# Patient Record
Sex: Male | Born: 2005 | Hispanic: No | Marital: Single | Smoking: Never smoker
Health system: Southern US, Community
[De-identification: ages and names within clinical notes are randomized; demographics above are authoritative.]

## PROBLEM LIST (undated history)

## (undated) DIAGNOSIS — M549 Dorsalgia, unspecified: Secondary | ICD-10-CM

## (undated) DIAGNOSIS — E785 Hyperlipidemia, unspecified: Secondary | ICD-10-CM

## (undated) DIAGNOSIS — H6692 Otitis media, unspecified, left ear: Secondary | ICD-10-CM

## (undated) DIAGNOSIS — E119 Type 2 diabetes mellitus without complications: Secondary | ICD-10-CM

## (undated) HISTORY — DX: Type 2 diabetes mellitus without complications: E11.9

## (undated) HISTORY — DX: Hyperlipidemia, unspecified: E78.5

## (undated) HISTORY — DX: Dorsalgia, unspecified: M54.9

---
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
## 2023-04-29 ENCOUNTER — Ambulatory Visit (INDEPENDENT_AMBULATORY_CARE_PROVIDER_SITE_OTHER): Payer: Medicaid Other | Admitting: Pediatrics

## 2023-04-29 ENCOUNTER — Encounter (INDEPENDENT_AMBULATORY_CARE_PROVIDER_SITE_OTHER): Payer: Self-pay

## 2023-04-29 ENCOUNTER — Encounter (INDEPENDENT_AMBULATORY_CARE_PROVIDER_SITE_OTHER): Payer: Self-pay | Admitting: Pediatrics

## 2023-04-29 VITALS — BP 120/82 | HR 72 | Ht 71.77 in | Wt 273.0 lb

## 2023-04-29 DIAGNOSIS — Z6837 Body mass index (BMI) 37.0-37.9, adult: Secondary | ICD-10-CM | POA: Diagnosis not present

## 2023-04-29 DIAGNOSIS — E559 Vitamin D deficiency, unspecified: Secondary | ICD-10-CM

## 2023-04-29 DIAGNOSIS — Z713 Dietary counseling and surveillance: Secondary | ICD-10-CM | POA: Diagnosis not present

## 2023-04-29 DIAGNOSIS — E66812 Obesity, class 2: Secondary | ICD-10-CM

## 2023-04-29 DIAGNOSIS — E8881 Metabolic syndrome: Secondary | ICD-10-CM | POA: Diagnosis not present

## 2023-04-29 DIAGNOSIS — E119 Type 2 diabetes mellitus without complications: Secondary | ICD-10-CM

## 2023-04-29 DIAGNOSIS — E781 Pure hyperglyceridemia: Secondary | ICD-10-CM | POA: Diagnosis not present

## 2023-04-29 DIAGNOSIS — Z7985 Long-term (current) use of injectable non-insulin antidiabetic drugs: Secondary | ICD-10-CM

## 2023-04-29 LAB — POCT GLUCOSE (DEVICE FOR HOME USE): POC Glucose: 110 mg/dL — AB (ref 70–99)

## 2023-04-29 LAB — POCT GLYCOSYLATED HEMOGLOBIN (HGB A1C): Hemoglobin A1C: 5.4 % (ref 4.0–5.6)

## 2023-04-29 MED ORDER — BD PEN NEEDLE NANO 2ND GEN 32G X 4 MM MISC
0 refills | Status: AC
Start: 2023-04-29 — End: ?

## 2023-04-29 MED ORDER — OZEMPIC (1 MG/DOSE) 4 MG/3ML ~~LOC~~ SOPN
PEN_INJECTOR | SUBCUTANEOUS | 5 refills | Status: DC
Start: 2023-04-29 — End: 2023-08-03

## 2023-04-29 NOTE — Assessment & Plan Note (Signed)
Continue working on dietary changes.

## 2023-04-29 NOTE — Assessment & Plan Note (Signed)
-  Hba1c stable and will continue Ozempic -continue working on lifestyle changes -fasting labs before next visit

## 2023-04-29 NOTE — Patient Instructions (Addendum)
HbA1c Goals: Our ultimate goal is to achieve the lowest possible HbA1c while avoiding recurrent severe hypoglycemia.  However, all HbA1c goals must be individualized per the American Diabetes Association Clinical Standards. My Hemoglobin A1c History:  Lab Results  Component Value Date   HGBA1C 5.4 04/29/2023   HGBA1C 5.4 12/25/2022   HGBA1C 5.6 09/19/2022   HGBA1C 6.5 (A) 06/26/2022   HGBA1C 6.4 (H) 03/25/2022   HGBA1C 5.7 (H) 11/26/2021   HGBA1C 5.6 05/28/2021   HGBA1C 5.8 (A) 02/25/2021   HGBA1C 6.2 (H) 08/16/2020   My goal HbA1c is: < 7 %  This is equivalent to an average blood glucose of:  HbA1c % = Average BG  5  97 (78-120)__ 6  126 (100-152)  7  154 (123-185) 8  183 (147-217)  9  212 (170-249)  10  240 (193-282)  11  269 (217-314)  12  298 (240-347)  13  330    Time in Range (TIR) Goals: Target Range over 70% of the time and Very Low less than 4% of the time.  Labs: Please obtain fasting (no eating, but can drink water) labs 1-2 weeks before the next visit.  Labs have been ordered to: [facility] is in our office Monday, Tuesday, Wednesday and Friday from 8AM-4PM, closed for lunch around 12pm-1pm. On Thursday, you can go to the third floor, Pediatric Neurology office at [address] [place], [place] [address]. You do not need an appointment, as they see patients in the order they arrive.  Let the front staff know that you are here for labs, and they will help you get to the [facility] lab. You can also go to any [facility] lab in your area as the request was sent electronically.   Insulin:  Type 2 diabetes managed with Ozempic and no regular testing of glucoses. Medications:  Please allow 3 days for prescription refill requests! After hours are for emergencies only.  Check Blood Glucose:  Before breakfast, before lunch, before dinner, at bedtime, and for symptoms of high or low blood glucose as a minimum.  Check BG 2 hours after meals if adjusting doses.   Check more frequently on days  with more activity than normal.   Check in the middle of the night when evening insulin doses are changed, on days with extra activity in the evening, and if you suspect overnight low glucoses are occurring.   Send a MyChart message as needed for patterns of high or low glucose levels, or multiple low glucoses. As a general rule, ALWAYS call us to review your child's blood glucoses IF: Your child has a seizure You have to use glucagon/Baqsimi/Gvoke or glucose gel to bring up the blood sugar  IF you notice a pattern of high blood sugars  If in a week, your child has: 1 blood glucose that is 40 or less  2 blood glucoses that are 50 or less at the same time of day 3 blood glucoses that are 60 or less at the same time of day  Phone: [phone] Ketones: Check urine or blood ketones, and if blood glucose is greater than 300 mg/dL (injections) or 629 mg/dL (pump), when ill, or if having symptoms of ketones.  Call if Urine Ketones are moderate or large Call if Blood Ketones are moderate (1-1.5) or large (more than1.5) Exercise Plan:  Any activity that makes you sweat most days for 60 minutes.  Safety Wear Medical Alert at [facility] [place] Citizens requesting the Yellow Dot Packages should  contact Airline pilot at the [facility] by calling [phone] or e-mail [email or web address] [email or web address]. TEEN REMINDERS:  Check blood glucose before driving If sexually active, use reliable birth control including condoms.  Alcohol in moderation only - check glucoses more frequently, & have a snack with no carb coverage. Glucose gel/cake icing for low glucose. Check glucoses in the middle of the night. Education:Please refer to your diabetes education book. A copy can be found here: [email or web address] Other: Schedule an eye exam yearly and a dental exam.  Recommend dental cleaning every 6 months. Get a flu vaccine  yearly, and Covid-19 vaccine yearly unless contraindicated. Rotate injections sites and avoid any hard lumps (lipohypertrophy)

## 2023-04-29 NOTE — Assessment & Plan Note (Signed)
-  fasting lipid panel before next visit

## 2023-04-29 NOTE — Progress Notes (Signed)
Pediatric Endocrinology Diabetes Consultation Follow-up Visit Eric Decker 2005/06/20 [id number] Jonetta Osgood, MD  HPI: Eric Decker  is a 17 y.o. male presenting for follow-up of Type 2 Diabetes. Interpreter present throughout the visit: No.  Since last visit on 12/25/2022, he has been well.  There have been no ER visits or hospitalizations. He has been doing well in school. Fall semester completed.  Other diabetes medication(s): Yes Ozempic 1mg , no missed doses and taking vitamin D gummies Hypoglycemia: can feel most low blood sugars.  No glucagon needed recently.  Med-alert ID: is not currently wearing. Injection/Pump sites: trunk Health maintenance:  Diabetes Health Maintenance Due  Topic Date Due   OPHTHALMOLOGY EXAM  Never done   HEMOGLOBIN A1C  10/28/2023   FOOT EXAM  12/25/2023    ROS: Greater than 10 systems reviewed with pertinent positives listed in HPI, otherwise neg. The following portions of the patient's history were reviewed and updated as appropriate:  Past Medical History:  has a past medical history of Acute mycotic otitis externa (03/17/2019), Acute otitis media of left ear in pediatric patient (02/08/2018), Back pain (11/18/2012), Diabetes mellitus without complication (HCC) (06/26/2022), Hyperlipidemia, Mixed hyperlipidemia (11/22/2020), Nonalcoholic fatty liver disease (40/98/1191), and Otitis externa (02/08/2018).  Medications:  Outpatient Encounter Medications as of 04/29/2023  Medication Sig   [DISCONTINUED] OZEMPIC, 1 MG/DOSE, 4 MG/3ML SOPN INJECT 1 MG INTO THE SKIN ONCE A WEEK FOR 4 DOSES   baclofen (LIORESAL) 10 MG tablet Take 1 tablet (10 mg total) by mouth 3 (three) times daily as needed for muscle spasms. (Patient not taking: Reported on 12/25/2022)   Blood Pressure Monitoring (BLOOD PRESSURE CUFF) MISC 1 application. by Does not apply route daily. (Patient not taking: Reported on 09/24/2022)   ibuprofen (ADVIL) 800 MG tablet Take by mouth. (Patient not  taking: Reported on 09/09/2022)   Insulin Pen Needle (BD PEN NEEDLE NANO 2ND GEN) 32G X 4 MM MISC Use as directed weekly with Ozempic   ketoconazole (NIZORAL) 2 % cream Apply 1 application. topically daily. (Patient not taking: Reported on 06/26/2022)   naproxen (NAPROSYN) 250 MG tablet TAKE 1 TABLET(250 MG) BY MOUTH TWICE DAILY WITH A MEAL (Patient not taking: Reported on 06/26/2022)   Semaglutide, 1 MG/DOSE, (OZEMPIC, 1 MG/DOSE,) 4 MG/3ML SOPN INJECT 1 MG INTO THE SKIN ONCE A WEEK FOR 4 DOSES   [DISCONTINUED] Insulin Pen Needle (BD PEN NEEDLE NANO 2ND GEN) 32G X 4 MM MISC Use as directed weekly with Ozempic (Patient not taking: Reported on 12/25/2022)   No facility-administered encounter medications on file as of 04/29/2023.   Allergies: No Known Allergies Surgical History:  History reviewed. No pertinent surgical history. Family History: family history is not on file.  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 Blood pressure %iles are not available for patients who are 18 years or older. 98 %ile (Z= 2.12) based on CDC (Boys, 2-20 Years) BMI-for-age  based on BMI available on 04/29/2023.   Ht Readings from Last 3 Encounters:  04/29/23 5' 11.77" (1.823 m) (78%, Z= 0.77)*  12/25/22 5' 11.65" (1.82 m) (77%, Z= 0.74)*  08/04/22 6' (1.829 m) (81%, Z= 0.88)*   * Growth percentiles are based on CDC (Boys, 2-20 Years) data.   Wt Readings from Last 3 Encounters:  04/29/23 273 lb (123.8 kg) (>99%, Z= 2.73)*  12/25/22 265 lb (120.2 kg) (>99%,  Z= 2.63)*  10/06/22 275 lb 9.6 oz (125 kg) (>99%, Z= 2.77)*   * Growth percentiles are based on CDC (Boys, 2-20 Years) data.    Physical Exam Vitals reviewed.  Constitutional:      Appearance: Normal appearance. He is not toxic-appearing.  HENT:     Head: Normocephalic and atraumatic.     Nose: Nose normal.     Mouth/Throat:     Mouth: Mucous membranes are moist.  Eyes:     Extraocular Movements: Extraocular movements intact.  Pulmonary:     Effort: Pulmonary effort is normal. No respiratory distress.  Abdominal:     General: There is no distension.  Musculoskeletal:        General: Normal range of motion.     Cervical back: Normal range of motion and neck supple.  Skin:    General: Skin is warm.     Capillary Refill: Capillary refill takes less than 2 seconds.     Comments: Lighter acanthosis --> mild now  Neurological:     General: No focal deficit present.     Mental Status: He is alert.     Gait: Gait normal.  Psychiatric:        Mood and Affect: Mood normal.        Behavior: Behavior normal.      Labs: No results found for: "ISLETAB", No results found for: "INSULINAB", No results found for: "GLUTAMICACAB", No results found for: "ZNT8AB" No results found for: "LABIA2" No results found for: "CPEPTIDE" Last hemoglobin A1c:  Lab Results  Component Value Date   HGBA1C 5.4 04/29/2023   Results for orders placed or performed in visit on 04/29/23  POCT Glucose (Device for Home Use)  Result Value Ref Range   Glucose Fasting, POC     POC Glucose 110 (A) 70 - 99 mg/dl  POCT glycosylated hemoglobin (Hb A1C)  Result Value Ref Range   Hemoglobin A1C 5.4 4.0 - 5.6 %   HbA1c POC (<> result, manual entry)     HbA1c, POC (prediabetic range)     HbA1c, POC (controlled diabetic range)     Lab Results  Component Value Date   HGBA1C 5.4 04/29/2023   HGBA1C 5.4 12/25/2022   HGBA1C 5.6 09/19/2022   Lab Results  Component Value Date   LDLCALC 80 09/19/2022   CREATININE  0.67 09/19/2022   No results found for: "TSH", "FREE T4"  Assessment/Plan: Eric Decker was seen today for controlled type 2 diabetes mellitus without complication, w.  Controlled type 2 diabetes mellitus without complication, without long-term current use of insulin (HCC) Overview: Type 2 diabetes diagnosed 06/26/2022 and treated with lifestyle changes + GLP-1 started March 2024. Annual studies last 05/2022.  Assessment & Plan: Diabetes mellitus Type II, under excellent control. The HbA1c is below goal of 7% or lower. No side effects to GLP-1, and working to decrease BMI and triglycerides.   Medications: continued GLP-1; See medication orders below, Laboratory Studies: Ordered fasting annual studies to be done 2-3 weeks before next visit; see below, Referrals:  Ophthalmology, and Provided Printed Education Material/has MyChart Access   Orders: -     COLLECTION CAPILLARY BLOOD SPECIMEN -     POCT Glucose (Device for Home Use) -     POCT glycosylated hemoglobin (Hb A1C) -     Ozempic (1 MG/DOSE); INJECT 1 MG INTO THE SKIN ONCE A WEEK FOR 4 DOSES  Dispense: 3 mL; Refill: 5 -     BD Pen Needle Nano 2nd Gen; Use as directed weekly with Ozempic  Dispense: 100 each; Refill: 0 -     Hemoglobin A1c -     Lipid panel -     VITAMIN D 25 Hydroxy (Vit-D Deficiency, Fractures) -     Microalbumin / creatinine urine ratio -     Ambulatory referral to Ophthalmology  Metabolic syndrome Overview: Metabolic syndrome diagnosed as he has type 2 diabetes 06/26/2022 when HbA1c 6.5%, MASH, hyperlipidemia and elevated BMI. GLP-1, ozempic started March 2024. 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 he established care with [facility] of End[facility]inology 11/22/2020.   Assessment & Plan: -Hba1c stable and will continue Ozempic -continue working on lifestyle changes -fasting labs before next visit  Orders: -     Ozempic (1 MG/DOSE); INJECT 1 MG INTO THE SKIN ONCE A WEEK FOR 4 DOSES  Dispense: 3 mL; Refill: 5 -     BD  Pen Needle Nano 2nd Gen; Use as directed weekly with Ozempic  Dispense: 100 each; Refill: 0 -     Hemoglobin A1c -     Lipid panel -     VITAMIN D 25 Hydroxy (Vit-D Deficiency, Fractures) -     Microalbumin / creatinine urine ratio -     Ambulatory referral to Ophthalmology  Hypertriglyceridemia Assessment & Plan: -fasting lipid panel before next visit  Orders: -     Ozempic (1 MG/DOSE); INJECT 1 MG INTO THE SKIN ONCE A WEEK FOR 4 DOSES  Dispense: 3 mL; Refill: 5 -     BD Pen Needle Nano 2nd Gen; Use as directed weekly with Ozempic  Dispense: 100 each; Refill: 0 -     Hemoglobin A1c -     Lipid panel -     VITAMIN D 25 Hydroxy (Vit-D Deficiency, Fractures) -     Microalbumin / creatinine urine ratio -     Ambulatory referral to Ophthalmology  Class 2 severe obesity due to excess calories with serious comorbidity and body mass index (BMI) of 37.0 to 37.9 in adult [facility]) Assessment & Plan: Continue working on dietary changes  Orders: -     Ozempic (1 MG/DOSE); INJECT 1 MG INTO THE SKIN ONCE A WEEK FOR 4 DOSES  Dispense: 3 mL; Refill: 5 -     BD Pen Needle Nano 2nd Gen; Use as directed weekly with Ozempic  Dispense: 100 each; Refill: 0  Vitamin D deficiency  Dietary counseling    Patient Instructions  HbA1c Goals: Our ultimate goal is to achieve the lowest possible HbA1c while avoiding recurrent severe hypoglycemia.  However, all HbA1c goals must be individualized per the American Diabetes Association Clinical Standards. My Hemoglobin A1c History:  Lab Results  Component Value Date   HGBA1C 5.4 04/29/2023   HGBA1C 5.4 12/25/2022   HGBA1C 5.6 09/19/2022   HGBA1C 6.5 (A) 06/26/2022   HGBA1C 6.4 (H) 03/25/2022   HGBA1C 5.7 (H) 11/26/2021   HGBA1C 5.6 05/28/2021   HGBA1C 5.8 (A) 02/25/2021   HGBA1C 6.2 (H) 08/16/2020   My goal HbA1c is: < 7 %  This is equivalent to an average blood glucose of:  HbA1c % = Average BG  5  97 (78-120)__ 6  126 (100-152)  7  154  (123-185) 8  183 (147-217)  9  212 (170-249)  10  240 (193-282)  11  269 (217-314)  12  298 (240-347)  13  330    Time in Range (TIR) Goals: Target Range over 70% of the time and Very Low less than 4% of the time.  Labs: Please obtain fasting (no eating, but can drink water) labs 1-2 weeks before the next visit.  Labs have been ordered to: [facility] is in our office Monday, Tuesday, Wednesday and Friday from 8AM-4PM, closed for lunch around 12pm-1pm. On Thursday, you can go to the third floor, Pediatric Neurology office at [address], [place], [place] [address]. You do not need an appointment, as they see patients in the order they arrive.  Let the front staff know that you are here for labs, and they will help you get to the [facility] lab. You can also go to any [facility] lab in your area as the request was sent electronically.   Insulin:  Type 2 diabetes managed with Ozempic and no regular testing of glucoses. Medications:  Please allow 3 days for prescription refill requests! After hours are for emergencies only.  Check Blood Glucose:  Before breakfast, before lunch, before dinner, at bedtime, and for symptoms of high or low blood glucose as a minimum.  Check BG 2 hours after meals if adjusting doses.   Check more frequently on days with more activity than normal.   Check in the middle of the night when evening insulin doses are changed, on days with extra activity in the evening, and if you suspect overnight low glucoses are occurring.   Send a MyChart message as needed for patterns of high or low glucose levels, or multiple low glucoses. As a general rule, ALWAYS call us to review your child's blood glucoses IF: Your child has a seizure You have to use glucagon/Baqsimi/Gvoke or glucose gel to bring up the blood sugar  IF you notice a pattern of high blood sugars  If in a week, your child has: 1 blood glucose that is 40 or less  2 blood glucoses that are 50 or less at the same time of day 3  blood glucoses that are 60 or less at the same time of day  Phone: [phone] Ketones: Check urine or blood ketones, and if blood glucose is greater than 300 mg/dL (injections) or 295 mg/dL (pump), when ill, or if having symptoms of ketones.  Call if Urine Ketones are moderate or large Call if Blood Ketones are moderate (1-1.5) or large (more than1.5) Exercise Plan:  Any activity that makes you sweat most days for 60 minutes.  Safety Wear Medical Alert at [facility] Times Citizens requesting the Yellow Dot Packages should contact Airline pilot at the [facility] by calling [phone] or e-mail [email or web address] [email or web address]. TEEN REMINDERS:  Check blood glucose before driving If sexually active, use reliable birth control including condoms.  Alcohol in moderation only - check glucoses more frequently, & have a snack with no carb coverage. Glucose gel/cake icing for low glucose. Check glucoses in the middle of the night. Education:Please refer to your diabetes education book. A copy can be found here: [email or web address] Other: Schedule an eye exam yearly and a dental exam.  Recommend dental cleaning every 6 months. Get a flu vaccine yearly, and  Covid-19 vaccine yearly unless contraindicated. Rotate injections sites and avoid any hard lumps (lipohypertrophy)   Follow-up:   Return in about 3 months (around 07/28/2023) for to review studies, follow up.   Medical decision-making:  I have personally spent 40 minutes involved in face-to-face and non-face-to-face activities for this patient on the day of the visit. Professional time spent includes the following activities, in addition to those noted in the documentation: preparation time/chart review, ordering of medications/tests/procedures, obtaining and/or reviewing separately obtained history, counseling and educating the patient/family/caregiver,  performing a medically appropriate examination and/or evaluation, referring and communicating with other health care professionals for care coordination,and documentation in the EHR.  Thank you for the opportunity to participate in the care of our mutual patient. Please do not hesitate to contact me should you have any questions regarding the assessment or treatment plan.   Sincerely,   Silvana Newness, MD

## 2023-04-29 NOTE — Assessment & Plan Note (Signed)
Diabetes mellitus Type II, under excellent control. The HbA1c is below goal of 7% or lower. No side effects to GLP-1, and working to decrease BMI and triglycerides.   Medications: continued GLP-1; See medication orders below, Laboratory Studies: Ordered fasting annual studies to be done 2-3 weeks before next visit; see below, Referrals: Ophthalmology, and Provided Printed Education Material/has MyChart Access

## 2023-04-30 ENCOUNTER — Encounter (INDEPENDENT_AMBULATORY_CARE_PROVIDER_SITE_OTHER): Payer: Self-pay

## 2023-08-03 ENCOUNTER — Other Ambulatory Visit (HOSPITAL_COMMUNITY): Payer: Self-pay

## 2023-08-03 ENCOUNTER — Other Ambulatory Visit (INDEPENDENT_AMBULATORY_CARE_PROVIDER_SITE_OTHER): Payer: Self-pay

## 2023-08-03 ENCOUNTER — Telehealth (INDEPENDENT_AMBULATORY_CARE_PROVIDER_SITE_OTHER): Payer: Self-pay | Admitting: Pharmacy Technician

## 2023-08-03 DIAGNOSIS — E781 Pure hyperglyceridemia: Secondary | ICD-10-CM

## 2023-08-03 DIAGNOSIS — E119 Type 2 diabetes mellitus without complications: Secondary | ICD-10-CM

## 2023-08-03 DIAGNOSIS — E8881 Metabolic syndrome: Secondary | ICD-10-CM

## 2023-08-03 MED ORDER — OZEMPIC (1 MG/DOSE) 4 MG/3ML ~~LOC~~ SOPN: PEN_INJECTOR | SUBCUTANEOUS | 5 refills | Status: DC

## 2023-08-03 NOTE — Telephone Encounter (Signed)
 Pharmacy Patient Advocate Encounter  Received notification from [facility] that Prior Authorization for Ozempic (1 MG/DOSE) 4MG /3ML pen-injectors has been APPROVED from 08/03/2023 to 08/02/2024. Ran test claim, Copay is $0.00. This test claim was processed through [facility]- copay amounts may vary at other pharmacies due to pharmacy/plan contracts, or as the patient moves through the different stages of their insurance plan.   PA #/Case ID/Reference #: [id number]

## 2023-08-03 NOTE — Telephone Encounter (Signed)
 Pharmacy Patient Advocate Encounter   Received notification from CoverMyMeds that prior authorization for Ozempic (1 MG/DOSE) 4MG /3ML pen-injectors is required/requested.   Insurance verification completed.   The patient is insured through [facility] .   Per test claim: PA required; PA submitted to above mentioned insurance via CoverMyMeds Key/confirmation #/EOC [id number] Status is pending

## 2023-08-10 DIAGNOSIS — Z7187 Encounter for pediatric-to-adult transition counseling: Secondary | ICD-10-CM | POA: Insufficient documentation

## 2023-08-10 NOTE — Progress Notes (Unsigned)
 Pediatric Endocrinology Diabetes Consultation Follow-up Visit Eric Decker Sep 09, 2005 [id number] Eric Osgood, MD  HPI: Eric Decker  is a 18 y.o. male presenting for follow-up of {DIABETES TYPE PLUS:20287}. he is accompanied to this visit by his {family members:20773}.{Interpreter present throughout the visit:29436::"No"}.  Since last visit on 04/29/2023, he has been well.  There have been no ER visits or hospitalizations.  Insulin regimen: ***units/kg/day {Basal Insulin:29550} *** units at *** {Bolus Insulin:29545}: {Insulin Increments:29547} Time Carb Ratio ISF/CF Target (mg/dL)  Breakfast {Carb WUJWJ:19147} {ISF/CF:29543} {Daytime Target:29542}  Lunch {Carb Ratio:29544} {ISF/CF:29543} {Daytime Target:29542}  Snack {Carb Ratio:29544} {ISF/CF:29543} {Daytime Target:29542}  Dinner {Carb Ratio:29544} {ISF/CF:29543} {Daytime Target:29542}  Bedtime {Carb Ratio:29544} {ISF/CF:29543} {Night Target:29541::"200 mg/dL"}  Other diabetes medication(s): {Yes/No:29440} Hypoglycemia: {can/cannot:17900} feel most low blood sugars.  No glucagon needed recently.  CGM download: {Continuous Glucose Monitor:29157}  Med-alert ID: {ACTION; IS/IS [id number]:[id number]} currently wearing. Injection/Pump sites: {body part:18749} Health maintenance:  Diabetes Health Maintenance Due  Topic Date Due   HEMOGLOBIN A1C  10/28/2023   FOOT EXAM  12/25/2023   OPHTHALMOLOGY EXAM  06/24/2024    ROS: Greater than 10 systems reviewed with pertinent positives listed in HPI, otherwise neg. The following portions of the patient's history were reviewed and updated as appropriate:  Past Medical History:  has a past medical history of Acute mycotic otitis externa (03/17/2019), Acute otitis media of left ear in pediatric patient (02/08/2018), Back pain (11/18/2012), Diabetes mellitus without complication (HCC) (06/26/2022), Hyperlipidemia, Mixed hyperlipidemia (11/22/2020), Nonalcoholic fatty liver disease (57/84/6962), and  Otitis externa (02/08/2018).  Medications:  Outpatient Encounter Medications as of 08/13/2023  Medication Sig   baclofen (LIORESAL) 10 MG tablet Take 1 tablet (10 mg total) by mouth 3 (three) times daily as needed for muscle spasms. (Patient not taking: Reported on 12/25/2022)   Blood Pressure Monitoring (BLOOD PRESSURE CUFF) MISC 1 application. by Does not apply route daily. (Patient not taking: Reported on 09/24/2022)   ibuprofen (ADVIL) 800 MG tablet Take by mouth. (Patient not taking: Reported on 09/09/2022)   Insulin Pen Needle (BD PEN NEEDLE NANO 2ND GEN) 32G X 4 MM MISC Use as directed weekly with Ozempic   ketoconazole (NIZORAL) 2 % cream Apply 1 application. topically daily. (Patient not taking: Reported on 06/26/2022)   naproxen (NAPROSYN) 250 MG tablet TAKE 1 TABLET(250 MG) BY MOUTH TWICE DAILY WITH A MEAL (Patient not taking: Reported on 06/26/2022)   Semaglutide, 1 MG/DOSE, (OZEMPIC, 1 MG/DOSE,) 4 MG/3ML SOPN INJECT 1 MG INTO THE SKIN ONCE A WEEK FOR 4 DOSES   [DISCONTINUED] Semaglutide, 1 MG/DOSE, (OZEMPIC, 1 MG/DOSE,) 4 MG/3ML SOPN INJECT 1 MG INTO THE SKIN ONCE A WEEK FOR 4 DOSES   No facility-administered encounter medications on file as of 08/13/2023.   Allergies: No Known Allergies Surgical History:  No past surgical history on file. Family History: family history is not on file.  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 There were no vitals taken for this visit. Body mass index: body mass index is unknown because there is no height or weight on file. Growth %ile SmartLinks can only be used for patients less than 48 years old. No height and weight on file for this encounter.   Ht Readings  from Last 3 Encounters:  04/29/23 5'  11.77" (1.823 m) (78%, Z= 0.77)*  12/25/22 5' 11.65" (1.82 m) (77%, Z= 0.74)*  08/04/22 6' (1.829 m) (81%, Z= 0.88)*   * Growth percentiles are based on CDC (Boys, 2-20 Years) data.   Wt Readings from Last 3 Encounters:  04/29/23 273 lb (123.8 kg) (>99%, Z= 2.73)*  12/25/22 265 lb (120.2 kg) (>99%, Z= 2.63)*  10/06/22 275 lb 9.6 oz (125 kg) (>99%, Z= 2.77)*   * Growth percentiles are based on CDC (Boys, 2-20 Years) data.    Physical Exam   Labs: No results found for: "ISLETAB", No results found for: "INSULINAB", No results found for: "GLUTAMICACAB", No results found for: "ZNT8AB" No results found for: "LABIA2" No results found for: "CPEPTIDE" Last hemoglobin A1c:  Lab Results  Component Value Date   HGBA1C 5.4 04/29/2023   Results for orders placed or performed in visit on 04/29/23  POCT Glucose (Device for Home Use)   Collection Time: 04/29/23 10:36 AM  Result Value Ref Range   Glucose Fasting, POC     POC Glucose 110 (A) 70 - 99 mg/dl  POCT glycosylated hemoglobin (Hb A1C)   Collection Time: 04/29/23 10:36 AM  Result Value Ref Range   Hemoglobin A1C 5.4 4.0 - 5.6 %   HbA1c POC (<> result, manual entry)     HbA1c, POC (prediabetic range)     HbA1c, POC (controlled diabetic range)     Lab Results  Component Value Date   HGBA1C 5.4 04/29/2023   HGBA1C 5.4 12/25/2022   HGBA1C 5.6 09/19/2022   Lab Results  Component Value Date   LDLCALC 80 09/19/2022   CREATININE 0.67 09/19/2022   No results found for: "TSH", "FREE T4"  Assessment/Plan: Controlled type 2 diabetes mellitus without complication, without long-term current use of insulin (HCC) Overview: Type 2 diabetes diagnosed 06/26/2022 and treated with lifestyle changes + GLP-1 started March 2024. Annual studies last 05/2022.   Hypertriglyceridemia  Class 2 severe obesity due to excess calories with serious comorbidity and body mass index (BMI) of 37.0 to 37.9 in adult [facility])    There are no Patient Instructions  on file for this visit.  Follow-up:   No follow-ups on file.   Medical decision-making:  I have personally spent *** minutes involved in face-to-face and non-face-to-face activities for this patient on the day of the visit. Professional time spent includes the following activities, in addition to those noted in the documentation: preparation time/chart review, ordering of medications/tests/procedures, obtaining and/or reviewing separately obtained history, counseling and educating the patient/family/caregiver, performing a medically appropriate examination and/or evaluation, referring and communicating with other health care professionals for care coordination, *** review and interpretation of glucose logs/continuous glucose monitor logs, *** interpretation of pump downloads, ***creating/updating school orders, and documentation in the EHR. This time does not include the time spent for CGM interpretation.   Thank you for the opportunity to participate in the care of our mutual patient. Please do not hesitate to contact me should you have any questions regarding the assessment or treatment plan.   Sincerely,   Silvana Newness, MD

## 2023-08-13 ENCOUNTER — Encounter (INDEPENDENT_AMBULATORY_CARE_PROVIDER_SITE_OTHER): Payer: Self-pay | Admitting: Pediatrics

## 2023-08-13 ENCOUNTER — Ambulatory Visit (INDEPENDENT_AMBULATORY_CARE_PROVIDER_SITE_OTHER): Payer: Self-pay | Admitting: Pediatrics

## 2023-08-13 VITALS — BP 102/68 | HR 86 | Wt 279.6 lb

## 2023-08-13 DIAGNOSIS — E119 Type 2 diabetes mellitus without complications: Secondary | ICD-10-CM | POA: Diagnosis not present

## 2023-08-13 DIAGNOSIS — Z6837 Body mass index (BMI) 37.0-37.9, adult: Secondary | ICD-10-CM | POA: Diagnosis not present

## 2023-08-13 DIAGNOSIS — Z7187 Encounter for pediatric-to-adult transition counseling: Secondary | ICD-10-CM

## 2023-08-13 DIAGNOSIS — E559 Vitamin D deficiency, unspecified: Secondary | ICD-10-CM | POA: Diagnosis not present

## 2023-08-13 DIAGNOSIS — E66812 Obesity, class 2: Secondary | ICD-10-CM | POA: Diagnosis not present

## 2023-08-13 DIAGNOSIS — Z7985 Long-term (current) use of injectable non-insulin antidiabetic drugs: Secondary | ICD-10-CM | POA: Diagnosis not present

## 2023-08-13 DIAGNOSIS — E781 Pure hyperglyceridemia: Secondary | ICD-10-CM | POA: Diagnosis not present

## 2023-08-13 LAB — POCT GLYCOSYLATED HEMOGLOBIN (HGB A1C): Hemoglobin A1C: 5.6 % (ref 4.0–5.6)

## 2023-08-13 LAB — POCT GLUCOSE (DEVICE FOR HOME USE): POC Glucose: 118 mg/dL — AB (ref 70–99)

## 2023-08-13 NOTE — Patient Instructions (Addendum)
 HbA1c Goals: Our ultimate goal is to achieve the lowest possible HbA1c while avoiding recurrent severe hypoglycemia.  However, all HbA1c goals must be individualized per the American Diabetes Association Clinical Standards. My Hemoglobin A1c History:  Lab Results  Component Value Date   HGBA1C 5.4 04/29/2023   HGBA1C 5.4 12/25/2022   HGBA1C 5.6 09/19/2022   HGBA1C 6.5 (A) 06/26/2022   HGBA1C 6.4 (H) 03/25/2022   HGBA1C 5.7 (H) 11/26/2021   HGBA1C 5.6 05/28/2021   HGBA1C 5.8 (A) 02/25/2021   HGBA1C 6.2 (H) 08/16/2020   My goal HbA1c is: < 7 %  This is equivalent to an average blood glucose of:  HbA1c % = Average BG  5  97 (78-120)__ 6  126 (100-152)  7  154 (123-185) 8  183 (147-217)  9  212 (170-249)  10  240 (193-282)  11  269 (217-314)  12  298 (240-347)  13  330    Time in Range (TIR) Goals: Target Range over 70% of the time and Very Low less than 4% of the time.  LABS: Please obtain fasting (no eating, but can drink water) labs 1-2 weeks before the next visit.  Labs have been ordered to: [facility] is in our office Monday, Tuesday, Wednesday and Friday from 8AM-4PM, closed for lunch around 12pm-1pm. On Thursday, you can go to the third floor, Pediatric Neurology office at [address], [place], [place] [address]. You do not need an appointment, as they see patients in the order they arrive.  Let the front staff know that you are here for labs, and they will help you get to the [facility] lab. You can also go to any [facility] lab in your area as the request was sent electronically. A popular location: [address] [place], [place] [address] Phone [phone].  Insulin:  Type 2 diabetes managed with Ozempic and no regular testing of glucoses. Medications:  Please allow 3 days for prescription refill requests! After hours are for emergencies only.  Check Blood Glucose:  Before breakfast, before lunch, before dinner, at bedtime, and for symptoms of high or low blood glucose as a minimum.   Check BG 2 hours after meals if adjusting doses.   Check more frequently on days with more activity than normal.   Check in the middle of the night when evening insulin doses are changed, on days with extra activity in the evening, and if you suspect overnight low glucoses are occurring.   Send a MyChart message as needed for patterns of high or low glucose levels, or multiple low glucoses. As a general rule, ALWAYS call us to review your child's blood glucoses IF: Your child has a seizure You have to use glucagon/Baqsimi/Gvoke or glucose gel to bring up the blood sugar  IF you notice a pattern of high blood sugars  If in a week, your child has: 1 blood glucose that is 40 or less  2 blood glucoses that are 50 or less at the same time of day 3 blood glucoses that are 60 or less at the same time of day  Phone: [phone] Ketones: Check urine or blood ketones, and if blood glucose is greater than 300 mg/dL (injections) or 578 mg/dL (pump), when ill, or if having symptoms of ketones.  Call if Urine Ketones are moderate or large Call if Blood Ketones are moderate (1-1.5) or large (more than1.5) Exercise Plan:  Any activity that makes you sweat most days for 60 minutes.  Safety  Wear Medical Alert at [facility] Times Citizens requesting the Yellow Dot Packages should contact Airline pilot at the [facility] by calling [phone] or e-mail aalmono@[place] .gov. TEEN REMINDERS:  Check blood glucose before driving If sexually active, use reliable birth control including condoms.  Alcohol in moderation only - check glucoses more frequently, & have a snack with no carb coverage. Glucose gel/cake icing for low glucose. Check glucoses in the middle of the night. Education:Please refer to your diabetes education book. A copy can be found here: [email or web address] Other: Schedule an eye exam yearly  and a dental exam.  Recommend dental cleaning every 6 months. Get a flu vaccine yearly, and Covid-19 vaccine yearly unless contraindicated. Rotate injections sites and avoid any hard lumps (lipohypertrophy)

## 2023-08-13 NOTE — Assessment & Plan Note (Signed)
 Diabetes mellitus Type II, under excellent control. The HbA1c is below goal of 7% or lower.   When a patient is on insulin, intensive monitoring of blood glucose levels and continuous insulin titration is vital to avoid hyperglycemia and hypoglycemia. Severe hypoglycemia can lead to seizure or death. Hyperglycemia can lead to ketosis requiring ICU admission and intravenous insulin.   Medications: continued GLP-1; See medication orders below, Laboratory Studies: Ordered fasting annual studies to be done 1-2 weeks before next visit; see below, Education: Encouraged aerobic exercise, Referrals: Adult Endocrinology, and Provided Armed forces operational officer

## 2023-08-14 ENCOUNTER — Encounter: Payer: Self-pay | Admitting: Pediatrics

## 2023-08-14 ENCOUNTER — Other Ambulatory Visit (HOSPITAL_COMMUNITY)
Admission: RE | Admit: 2023-08-14 | Discharge: 2023-08-14 | Disposition: A | Discharge status: Home / Self Care | Source: Ambulatory Visit | Attending: Pediatrics | Admitting: Pediatrics

## 2023-08-14 ENCOUNTER — Ambulatory Visit: Admitting: Pediatrics

## 2023-08-14 VITALS — BP 116/68 | HR 93 | Ht 71.9 in | Wt 280.0 lb

## 2023-08-14 DIAGNOSIS — E669 Obesity, unspecified: Secondary | ICD-10-CM | POA: Diagnosis not present

## 2023-08-14 DIAGNOSIS — Z113 Encounter for screening for infections with a predominantly sexual mode of transmission: Secondary | ICD-10-CM

## 2023-08-14 DIAGNOSIS — Z68.41 Body mass index (BMI) pediatric, greater than or equal to 95th percentile for age: Secondary | ICD-10-CM

## 2023-08-14 DIAGNOSIS — Z1339 Encounter for screening examination for other mental health and behavioral disorders: Secondary | ICD-10-CM

## 2023-08-14 DIAGNOSIS — Z114 Encounter for screening for human immunodeficiency virus [HIV]: Secondary | ICD-10-CM

## 2023-08-14 DIAGNOSIS — Z1331 Encounter for screening for depression: Secondary | ICD-10-CM

## 2023-08-14 DIAGNOSIS — E8881 Metabolic syndrome: Secondary | ICD-10-CM

## 2023-08-14 DIAGNOSIS — Z0000 Encounter for general adult medical examination without abnormal findings: Secondary | ICD-10-CM | POA: Diagnosis not present

## 2023-08-14 DIAGNOSIS — Z23 Encounter for immunization: Secondary | ICD-10-CM | POA: Diagnosis not present

## 2023-08-14 DIAGNOSIS — E119 Type 2 diabetes mellitus without complications: Secondary | ICD-10-CM | POA: Diagnosis not present

## 2023-08-14 LAB — POCT RAPID HIV: Rapid HIV, POC: NEGATIVE

## 2023-08-14 NOTE — Patient Instructions (Signed)
 Adult Primary Care Clinics Name Criteria Services   [facility]   [facility]  Uninsured  [place] A "medical home" for adults needing healthcare when it's not an emergency. Chronic disease management Disease prevention, diagnosis and treatment Onsite point-of-care laboratory testing. Health education and prevention programs. Physicals and immunizations  [address] [place], [place] [address]  Phone: [phone] Hours: Mon-Fri 9am-6pm Walk-in: Tues 2pm-5pm                 Thurs 8:30am-4:30pm Languages:  Language line available  Serves Adult patients    Name Criteria Services  [facility] Insurance   [facility]  [address] [place], [place], [place] [address]  Phone: [phone]  Languages:  Access to language line

## 2023-08-14 NOTE — Progress Notes (Signed)
 Adolescent Well Care Visit Eric Decker is a 18 y.o. male who is here for well care.     PCP:  Jonetta Osgood, MD   History was provided by the patient.  Confidentiality was discussed with the patient and, if applicable, with caregiver as well. Patient's personal or confidential phone number:    Current Issues: Current concerns include   Followed by endo - transitioning to adult care  Nutrition: Nutrition/Eating Behaviors: no new concerns - still eats from [employer] some, some at home Adequate calcium in diet?: yes Supplements/ Vitamins: none  Exercise/ Media: Play any Sports?:  none Exercise:  not active Screen Time:  > 2 hours-counseling provided Media Rules or Monitoring?: yes  Sleep:  Sleep: adequate  Social Screening: Lives with:  parents, younger siblings Parental relations:  good Activities, Work, and Regulatory affairs officer?: works at [employer] regarding behavior with peers?  no Stressors of note: no  Education: School Name: [employer]  School Grade: Engineer, drilling: doing well; no concerns School Behavior: doing well; no concerns  Patient has a dental home: yes  Confidential social history: Tobacco?  no Secondhand smoke exposure?  no Drugs/ETOH?  no  Sexually Active?  no   Pregnancy Prevention:   Safe at home, in school & in relationships?  Yes Safe to self?  Yes   Screenings:  The patient completed the Rapid Assessment for Adolescent Preventive Services screening questionnaire and the following topics were identified as risk factors and discussed: healthy eating and exercise  In addition, the following topics were discussed as part of anticipatory guidance healthy eating and exercise.  PHQ-9 completed and results indicated no concerns  Physical Exam:  Vitals:   08/14/23 1029  BP: 116/68  Pulse: 93   BP 116/68 (BP Location: Left Arm, Patient Position: Sitting, Cuff Size: Normal) Comment (Cuff Size): navy blue adult large  Pulse 93   Body mass index: body mass index is unknown because there is no height or weight on file. Growth %ile SmartLinks can only be used for patients less than 9 years old.  Hearing Screening   500Hz  1000Hz  2000Hz  4000Hz   Right ear 20 20 20 20   Left ear 20 20 20 20    Vision Screening   Right eye Left eye Both eyes  Without correction 20/20 20/20 20/20   With correction       Physical Exam Vitals and nursing note reviewed.  Constitutional:      General: He is not in acute distress.    Appearance: He is well-developed.  HENT:     Head: Normocephalic.     Right Ear: External ear normal.     Left Ear: External ear normal.     Nose: Nose normal.     Mouth/Throat:     Pharynx: No oropharyngeal exudate.  Eyes:     Conjunctiva/sclera: Conjunctivae normal.     Pupils: Pupils are equal, round, and reactive to light.  Neck:     Thyroid: No thyromegaly.  Cardiovascular:     Rate and Rhythm: Normal rate.     Heart sounds: Normal heart sounds. No murmur heard. Pulmonary:     Effort: Pulmonary effort is normal.     Breath sounds: Normal breath sounds.  Abdominal:     General: Bowel sounds are normal.     Palpations: Abdomen is soft. There is no mass.     Tenderness: There is no abdominal tenderness.     Hernia: There is no hernia in the left inguinal area.  Genitourinary:  Penis: Normal.      Testes: Normal.        Right: Mass not present. Right testis is descended.        Left: Mass not present. Left testis is descended.  Musculoskeletal:        General: Normal range of motion.     Cervical back: Normal range of motion and neck supple.  Lymphadenopathy:     Cervical: No cervical adenopathy.  Skin:    General: Skin is warm and dry.     Findings: No rash.  Neurological:     Mental Status: He is alert and oriented to person, place, and time.     Cranial Nerves: No cranial nerve deficit.      Assessment and Plan:   1. Encounter for general adult medical examination without  abnormal findings (Primary)  2. Screening for venereal disease - Urine cytology ancillary only  3. 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 Need for vaccination - Flu vaccine trivalent PF, 6mos and older(Flulaval,Afluria,Fluarix,Fluzone)  6. Metabolic syndrome Followed by endo  7. Controlled type 2 diabetes mellitus without complication, without long-term current use of insulin (HCC) Followed by endo  Reviewed transition to adult care - information given   BMI is not appropriate for age  Hearing screening result:normal Vision screening result: normal  Counseling provided for all of the vaccine components  Orders Placed This Encounter  Procedures   POCT Rapid HIV   Transition to adult medicine   No follow-ups on file.Dory Peru, MD

## 2023-08-17 LAB — URINE CYTOLOGY ANCILLARY ONLY
Chlamydia: NEGATIVE
Comment: NEGATIVE
Comment: NORMAL
Neisseria Gonorrhea: NEGATIVE

## 2023-10-30 IMAGING — CR DG FEMUR 2+V*R*
4 series · 4 of 4 positions shown · non-contrast
Comparison: Pelvis and left hip radiographs 01/18/2015

CLINICAL DATA: Right mid thigh pain radiating up to the hip.

EXAM:
RIGHT FEMUR 2 VIEWS

[t femur with hip  ap right]
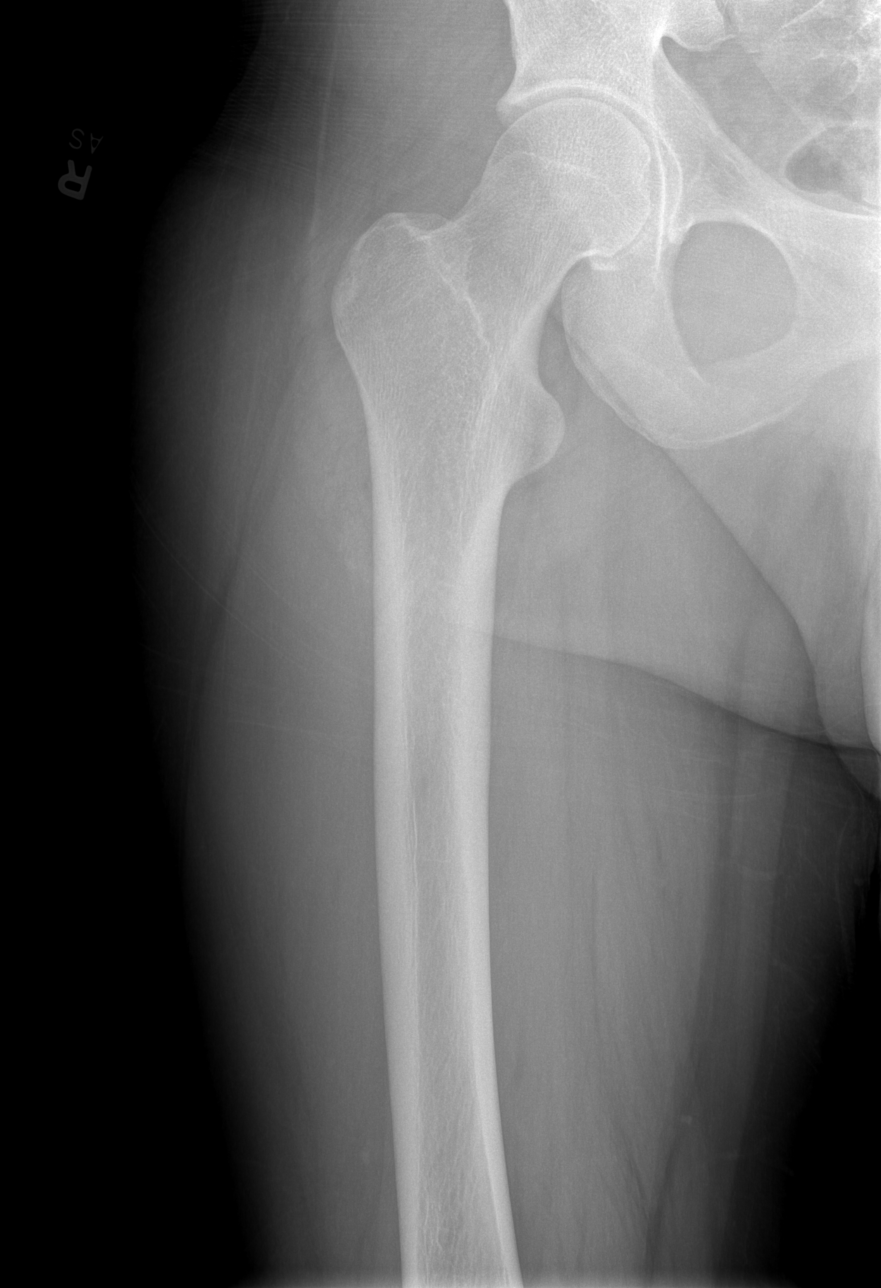

[t femur with knee ap right]
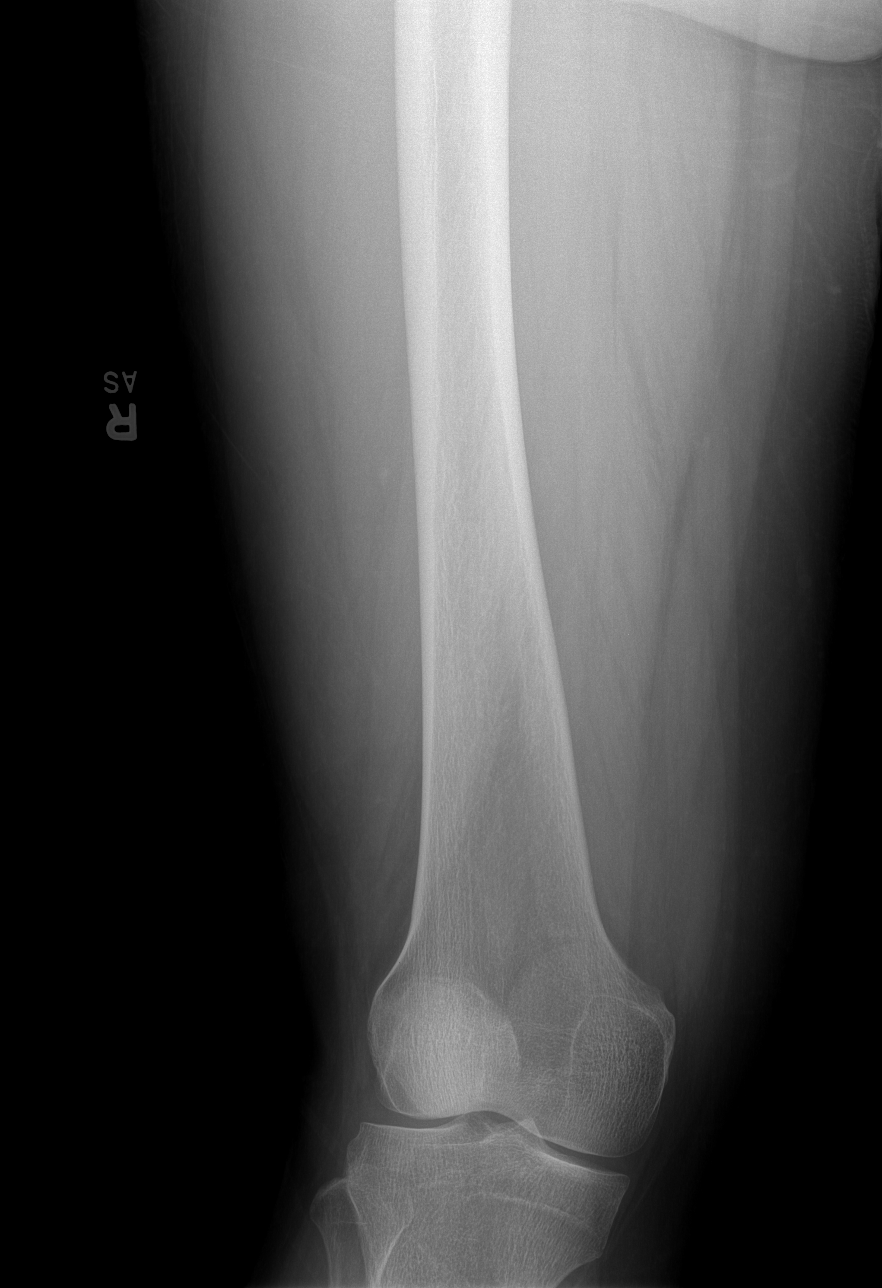

[t femur with hip lat right]
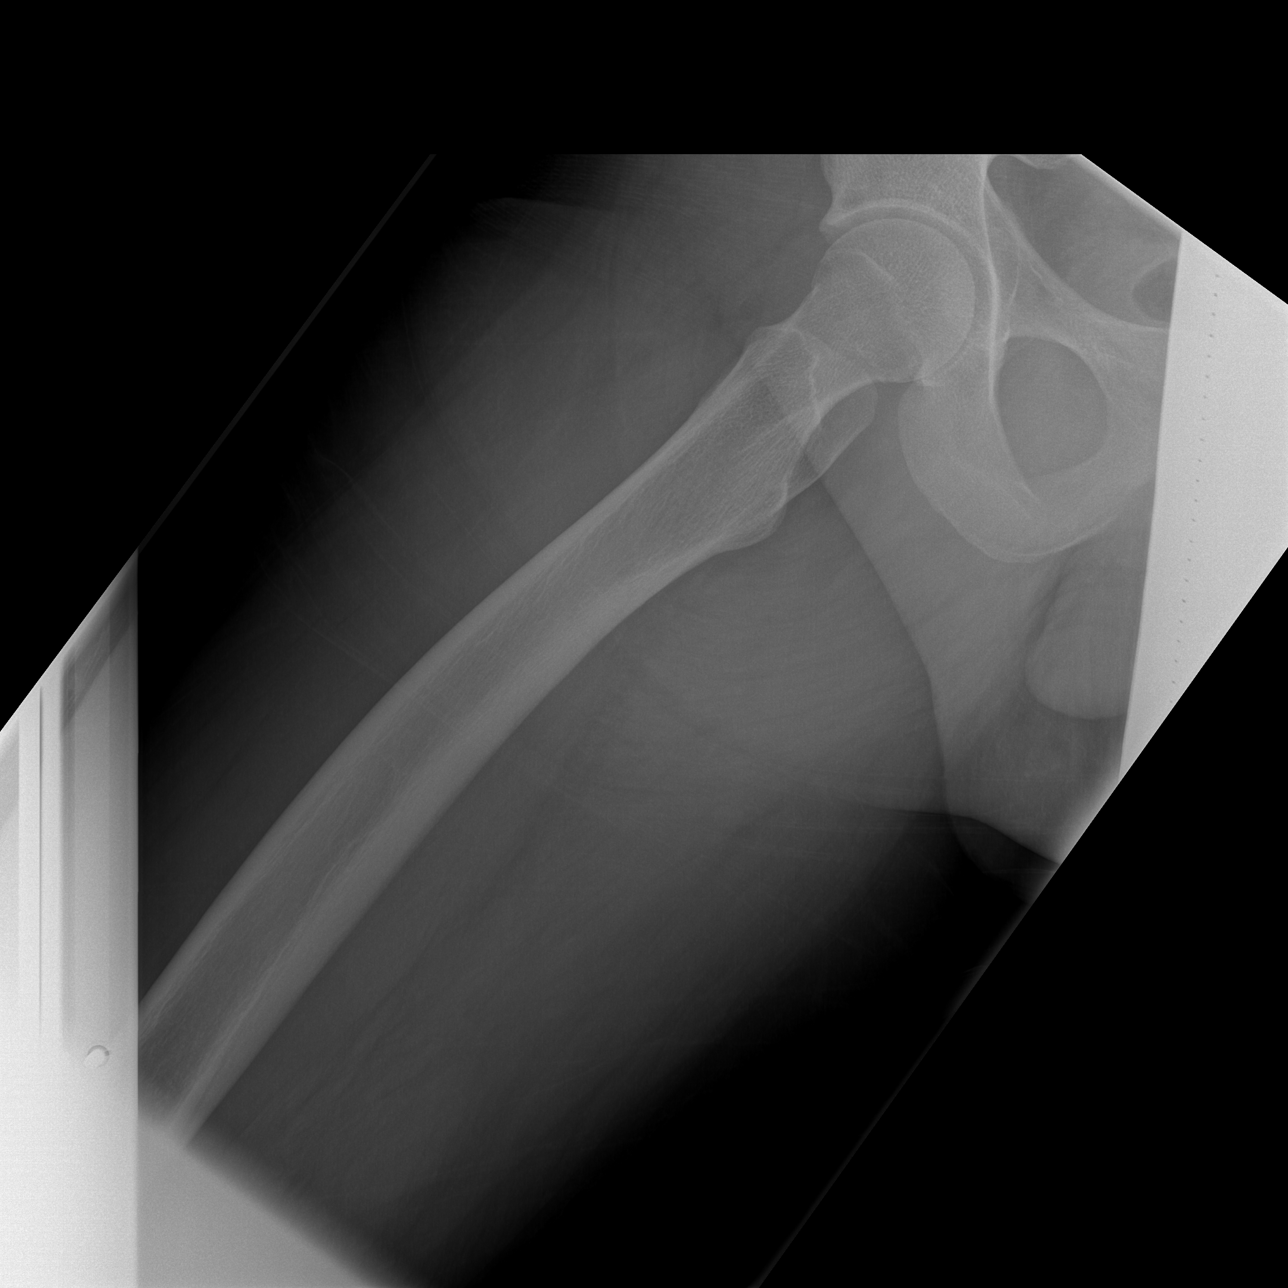

[t femur with knee lat right]
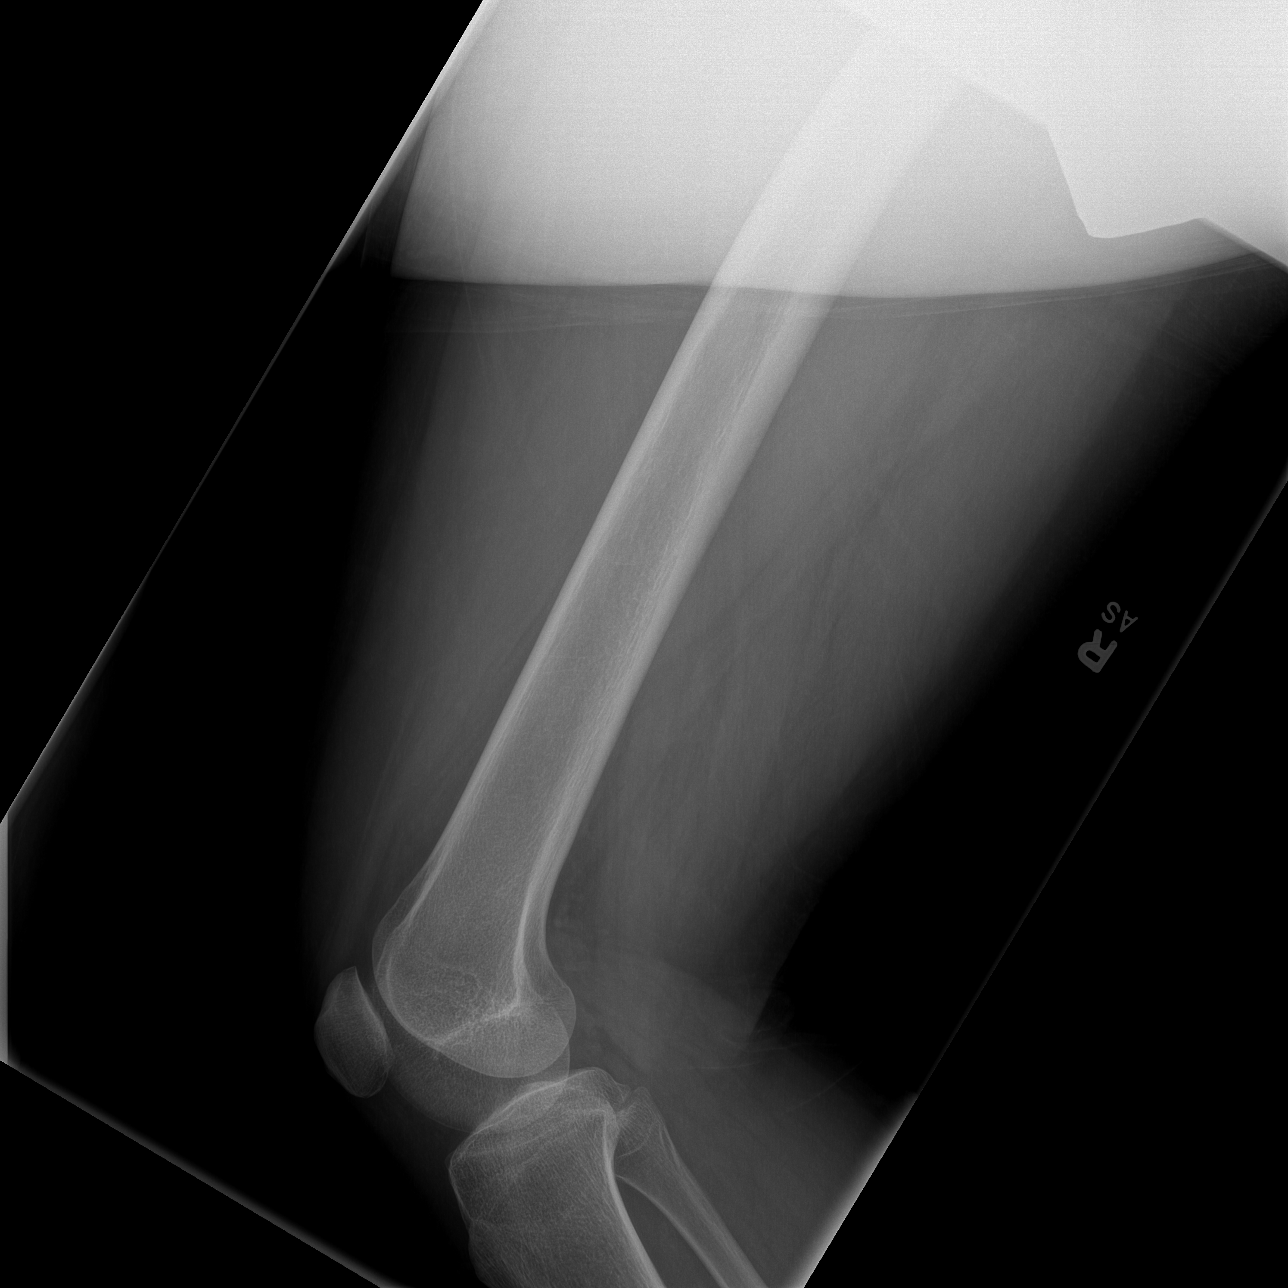

[4 of 4 positions shown; findings below may reference images not displayed]

FINDINGS: The right femoroacetabular joint space is maintained. The knee joint
spaces are maintained. No knee joint effusion. No acute fracture or
dislocation.
IMPRESSION: Normal right femur radiographs.

## 2023-11-16 NOTE — Progress Notes (Deleted)
 Pediatric Endocrinology Diabetes Consultation Follow-up Visit Eric Decker 12/29/2005 [id number] Delores Clapper, MD  HPI: Eric Decker  is a 18 y.o. male presenting for follow-up of {DIABETES TYPE PLUS:20287}. he is accompanied to this visit by his {family members:20773}.{Interpreter present throughout the visit:29436::No}.  Since last visit on 08/13/2023, he has been well.  There have been no ER visits or hospitalizations. He was referred to adult endocrinology who called 3 times and closed the referral due to no response.   Insulin regimen: ***units/kg/day {Basal Insulin:29550} *** units at *** {Bolus Insulin:29545}: {Insulin Increments:29547}   Carb ratio: ***   ISF: ***   Target: *** Other diabetes medication(s): {Yes/No:29440} Hypoglycemia: {can/cannot:17900} feel most low blood sugars.  No glucagon needed recently.  CGM download: {Continuous Glucose Monitor:29157}  Med-alert ID: {ACTION; IS/IS WNU:[id number]} currently wearing. Injection/Pump sites: {body part:18749} Health maintenance:  Diabetes Health Maintenance Due  Topic Date Due   FOOT EXAM  12/25/2023   HEMOGLOBIN A1C  02/13/2024   OPHTHALMOLOGY EXAM  06/24/2024    ROS: Greater than 10 systems reviewed with pertinent positives listed in HPI, otherwise neg. The following portions of the patient's history were reviewed and updated as appropriate:  Past Medical History:  has a past medical history of Acute mycotic otitis externa (03/17/2019), Acute otitis media of left ear in pediatric patient (02/08/2018), Back pain (11/18/2012), Diabetes mellitus without complication (HCC) (06/26/2022), Hyperlipidemia, Mixed hyperlipidemia (11/22/2020), Nonalcoholic fatty liver disease (98/89/7976), and Otitis externa (02/08/2018).  Medications:  Outpatient Encounter Medications as of 11/17/2023  Medication Sig   baclofen  (LIORESAL ) 10 MG tablet Take 1 tablet (10 mg total) by mouth 3 (three) times daily as needed for muscle spasms.  (Patient not taking: Reported on 08/14/2023)   Blood Pressure Monitoring (BLOOD PRESSURE CUFF) MISC 1 application. by Does not apply route daily. (Patient not taking: Reported on 09/24/2022)   ibuprofen  (ADVIL ) 800 MG tablet Take by mouth. (Patient not taking: Reported on 09/09/2022)   Insulin Pen Needle (BD PEN NEEDLE NANO 2ND GEN) 32G X 4 MM MISC Use as directed weekly with Ozempic    ketoconazole  (NIZORAL ) 2 % cream Apply 1 application. topically daily. (Patient not taking: Reported on 06/26/2022)   naproxen  (NAPROSYN ) 250 MG tablet TAKE 1 TABLET(250 MG) BY MOUTH TWICE DAILY WITH A MEAL (Patient not taking: Reported on 08/14/2023)   Semaglutide , 1 MG/DOSE, (OZEMPIC , 1 MG/DOSE,) 4 MG/3ML SOPN INJECT 1 MG INTO THE SKIN ONCE A WEEK FOR 4 DOSES   No facility-administered encounter medications on file as of 11/17/2023.   Allergies: No Known Allergies Surgical History:  No past surgical history on file. Family History: family history is not on file.  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 There were no vitals taken for this visit. Body mass index: body mass index is unknown because there is no height or weight on file. Growth %ile SmartLinks can only be used for patients less than 20 years old. No height and weight on file for this encounter.   Ht Readings from Last 3 Encounters:  08/14/23 5' 11.9 (1.826 m)  04/29/23 5' 11.77 (1.823 m) (78%, Z= 0.77)*  12/25/22 5' 11.65 (1.82 m) (77%, Z= 0.74)*   * Growth percentiles are based  on CDC (Boys, 2-20 Years) data.   Wt Readings from Last 3 Encounters:  08/14/23 280 lb (127 kg)  08/13/23 279 lb 9.6 oz (126.8 kg)  04/29/23 273 lb (123.8 kg) (>99%, Z= 2.73)*   * Growth percentiles are based on  CDC (Boys, 2-20 Years) data.    Physical Exam   Labs: No results found for: ISLETAB, No results found for: INSULINAB, No results found for: GLUTAMICACAB, No results found for: ZNT8AB No results found for: LABIA2 No results found for: CPEPTIDE Last hemoglobin A1c:  Lab Results  Component Value Date   HGBA1C 5.6 08/13/2023   Results for orders placed or performed in visit on 08/14/23  Urine cytology ancillary only   Collection Time: 08/14/23 10:06 AM  Result Value Ref Range   Neisseria Gonorrhea Negative    Chlamydia Negative    Comment Normal Reference Ranger Chlamydia - Negative    Comment      Normal Reference Range Neisseria Gonorrhea - Negative  POCT Rapid HIV   Collection Time: 08/14/23 12:05 PM  Result Value Ref Range   Rapid HIV, POC Negative    Lab Results  Component Value Date   HGBA1C 5.6 08/13/2023   HGBA1C 5.4 04/29/2023   HGBA1C 5.4 12/25/2022   Lab Results  Component Value Date   LDLCALC 80 09/19/2022   CREATININE 0.67 09/19/2022   No results found for: TSH, FREE T4  Assessment/Plan: There are no diagnoses linked to this encounter.  There are no Patient Instructions on file for this visit.  Follow-up:   No follow-ups on file.   Medical decision-making:  I have personally spent *** minutes involved in face-to-face and non-face-to-face activities for this patient on the day of the visit. Professional time spent includes the following activities, in addition to those noted in the documentation: preparation time/chart review, ordering of medications/tests/procedures, obtaining and/or reviewing separately obtained history, counseling and educating the patient/family/caregiver, performing a medically appropriate examination and/or evaluation, referring and communicating with other health care professionals for care coordination, *** review and interpretation of glucose logs/continuous glucose monitor logs, *** interpretation of pump downloads,  ***creating/updating school orders, and documentation in the EHR. This time does not include the time spent for CGM interpretation.   Thank you for the opportunity to participate in the care of our mutual patient. Please do not hesitate to contact me should you have any questions regarding the assessment or treatment plan.   Sincerely,   Marce Rucks, MD

## 2023-11-17 ENCOUNTER — Ambulatory Visit (INDEPENDENT_AMBULATORY_CARE_PROVIDER_SITE_OTHER): Admitting: Pediatrics

## 2023-11-17 DIAGNOSIS — E119 Type 2 diabetes mellitus without complications: Secondary | ICD-10-CM

## 2023-11-17 DIAGNOSIS — Z7187 Encounter for pediatric-to-adult transition counseling: Secondary | ICD-10-CM

## 2023-11-17 DIAGNOSIS — E8881 Metabolic syndrome: Secondary | ICD-10-CM

## 2023-11-18 ENCOUNTER — Ambulatory Visit (INDEPENDENT_AMBULATORY_CARE_PROVIDER_SITE_OTHER): Admitting: Pediatrics

## 2024-01-22 ENCOUNTER — Encounter (INDEPENDENT_AMBULATORY_CARE_PROVIDER_SITE_OTHER): Payer: Self-pay | Admitting: Pediatrics

## 2024-01-22 DIAGNOSIS — E119 Type 2 diabetes mellitus without complications: Secondary | ICD-10-CM | POA: Diagnosis not present

## 2024-01-22 DIAGNOSIS — E781 Pure hyperglyceridemia: Secondary | ICD-10-CM | POA: Diagnosis not present

## 2024-01-22 DIAGNOSIS — E559 Vitamin D deficiency, unspecified: Secondary | ICD-10-CM | POA: Diagnosis not present

## 2024-01-23 LAB — COMPREHENSIVE METABOLIC PANEL WITH GFR
AG Ratio: 1.6 (calc) (ref 1.0–2.5)
ALT: 34 U/L (ref 9–46)
AST: 18 U/L (ref 10–40)
Albumin: 4.3 g/dL (ref 3.6–5.1)
Alkaline phosphatase (APISO): 84 U/L (ref 36–130)
BUN: 9 mg/dL (ref 7–25)
CO2: 24 mmol/L (ref 20–32)
Calcium: 9.1 mg/dL (ref 8.6–10.3)
Chloride: 105 mmol/L (ref 98–110)
Creat: 0.6 mg/dL (ref 0.60–1.24)
Globulin: 2.7 g/dL (ref 1.9–3.7)
Glucose, Bld: 137 mg/dL (ref 65–139)
Potassium: 3.6 mmol/L (ref 3.5–5.3)
Sodium: 138 mmol/L (ref 135–146)
Total Bilirubin: 0.5 mg/dL (ref 0.2–1.2)
Total Protein: 7 g/dL (ref 6.1–8.1)
eGFR: 142 mL/min/1.73m2 (ref >=60)

## 2024-01-23 LAB — LIPID PANEL
Cholesterol: 149 mg/dL (ref <200)
HDL: 39 mg/dL — ABNORMAL LOW (ref >=40)
LDL Cholesterol (Calc): 86 mg/dL
Non-HDL Cholesterol (Calc): 110 mg/dL (ref <130)
Total CHOL/HDL Ratio: 3.8 (calc) (ref <5.0)
Triglycerides: 140 mg/dL (ref <150)

## 2024-01-23 LAB — HEMOGLOBIN A1C
Hgb A1c MFr Bld: 5.6 % (ref <5.7)
Mean Plasma Glucose: 114 mg/dL
eAG (mmol/L): 6.3 mmol/L

## 2024-01-23 LAB — MICROALBUMIN / CREATININE URINE RATIO
Creatinine, Urine: 129 mg/dL (ref 20–320)
Microalb Creat Ratio: 13 mg/g{creat} (ref <30)
Microalb, Ur: 1.7 mg/dL

## 2024-01-23 LAB — VITAMIN D 25 HYDROXY (VIT D DEFICIENCY, FRACTURES): Vit D, 25-Hydroxy: 22 ng/mL — ABNORMAL LOW (ref 30–100)

## 2024-01-28 ENCOUNTER — Encounter (INDEPENDENT_AMBULATORY_CARE_PROVIDER_SITE_OTHER): Payer: Self-pay | Admitting: Pediatrics

## 2024-01-28 ENCOUNTER — Ambulatory Visit (INDEPENDENT_AMBULATORY_CARE_PROVIDER_SITE_OTHER): Admitting: Pediatrics

## 2024-01-28 VITALS — BP 140/78 | HR 100 | Ht 71.85 in | Wt 285.4 lb

## 2024-01-28 DIAGNOSIS — E66812 Obesity, class 2: Secondary | ICD-10-CM

## 2024-01-28 DIAGNOSIS — E781 Pure hyperglyceridemia: Secondary | ICD-10-CM | POA: Diagnosis not present

## 2024-01-28 DIAGNOSIS — E119 Type 2 diabetes mellitus without complications: Secondary | ICD-10-CM

## 2024-01-28 DIAGNOSIS — Z6838 Body mass index (BMI) 38.0-38.9, adult: Secondary | ICD-10-CM

## 2024-01-28 DIAGNOSIS — E8881 Metabolic syndrome: Secondary | ICD-10-CM | POA: Diagnosis not present

## 2024-01-28 DIAGNOSIS — Z7985 Long-term (current) use of injectable non-insulin antidiabetic drugs: Secondary | ICD-10-CM | POA: Diagnosis not present

## 2024-01-28 DIAGNOSIS — Z7187 Encounter for pediatric-to-adult transition counseling: Secondary | ICD-10-CM

## 2024-01-28 MED ORDER — OZEMPIC (1 MG/DOSE) 4 MG/3ML ~~LOC~~ SOPN: PEN_INJECTOR | SUBCUTANEOUS | 5 refills | Status: AC

## 2024-01-28 NOTE — Progress Notes (Unsigned)
 Pediatric Endocrinology Diabetes Consultation Follow-up Visit Terre Zabriskie [birth year] [id number] Delores Clapper, MD  HPI: Eric Decker  is a 18 y.o. male presenting for follow-up of Type 2 Diabetes. Interpreter present throughout the visit: No.  Since last visit on 08/13/2023, he has been well.  There have been no ER visits or hospitalizations. He has been working as a Curator as an Teacher, English as a foreign language and not taking care of his diabetes as well as he has in the past. He did not check mychart and did not answer calls from adult endo to schedule appointment.   Insulin regimen: none Other diabetes medication(s): Yes ozempic  1mg  weekly, due for this weeks dose by a day. GI upset only if he over eats. Hypoglycemia: can feel most low blood sugars.  No glucagon needed recently.  Not checking BG. Med-alert ID: is not currently wearing. Injection/Pump sites: trunk Health maintenance:  Diabetes Health Maintenance Due  Topic Date Due   FOOT EXAM  12/25/2023   OPHTHALMOLOGY EXAM  06/24/2024   HEMOGLOBIN A1C  07/21/2024    ROS: Greater than 10 systems reviewed with pertinent positives listed in HPI, otherwise neg. The following portions of the patient's history were reviewed and updated as appropriate:  Past Medical History:  has a past medical history of Acute mycotic otitis externa (03/17/2019), Acute otitis media of left ear in pediatric patient (02/08/2018), Back pain (11/18/2012), Diabetes mellitus without complication (HCC) (06/26/2022), Hyperlipidemia, Mixed hyperlipidemia (11/22/2020), Nonalcoholic fatty liver disease (98/89/7976), and Otitis externa (02/08/2018).  Medications:  Outpatient Encounter Medications as of 01/28/2024  Medication Sig   Insulin Pen Needle (BD PEN NEEDLE NANO 2ND GEN) 32G X 4 MM MISC Use as directed weekly with Ozempic    [DISCONTINUED] Semaglutide , 1 MG/DOSE, (OZEMPIC , 1 MG/DOSE,) 4 MG/3ML SOPN INJECT 1 MG INTO THE SKIN ONCE A WEEK FOR 4 DOSES   ibuprofen  (ADVIL ) 800  MG tablet Take by mouth. (Patient not taking: Reported on 09/09/2022)   Semaglutide , 1 MG/DOSE, (OZEMPIC , 1 MG/DOSE,) 4 MG/3ML SOPN INJECT 1 MG INTO THE SKIN ONCE A WEEK FOR 4 DOSES   [DISCONTINUED] baclofen  (LIORESAL ) 10 MG tablet Take 1 tablet (10 mg total) by mouth 3 (three) times daily as needed for muscle spasms. (Patient not taking: Reported on 08/14/2023)   [DISCONTINUED] Blood Pressure Monitoring (BLOOD PRESSURE CUFF) MISC 1 application. by Does not apply route daily. (Patient not taking: Reported on 09/24/2022)   [DISCONTINUED] ketoconazole  (NIZORAL ) 2 % cream Apply 1 application. topically daily. (Patient not taking: Reported on 06/26/2022)   [DISCONTINUED] naproxen  (NAPROSYN ) 250 MG tablet TAKE 1 TABLET(250 MG) BY MOUTH TWICE DAILY WITH A MEAL (Patient not taking: Reported on 08/14/2023)   No facility-administered encounter medications on file as of 01/28/2024.   Allergies: No Known Allergies Surgical History:  History reviewed. No pertinent surgical history. Family History: family history is not on file.  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 Growth %ile SmartLinks can only be used for patients less than 80 years old. Facility age limit for growth %iles is 20 years.   Ht Readings from Last 3 Encounters:  01/28/24 5' 11.85 (1.825 m)  08/14/23 5' 11.9 (1.826 m)  04/29/23 5' 11.77 (1.823 m) (78%, Z= 0.77)*   * Growth percentiles are based on CDC (Boys, 2-20 Years) data.   Wt Readings from Last 3  Encounters:  01/28/24 285 lb 6.4 oz (129.5 kg)  08/14/23 280 lb (127 kg)  08/13/23 279 lb 9.6 oz (126.8 kg)    Physical Exam Vitals reviewed.  Constitutional:      Appearance: Normal appearance. He is not toxic-appearing.  HENT:     Head: Normocephalic and atraumatic.     Nose: Nose normal.     Mouth/Throat:     Mouth: Mucous membranes are moist.  Eyes:     Extraocular Movements: Extraocular movements intact.  Pulmonary:     Effort: Pulmonary effort is normal. No respiratory distress.  Abdominal:     General: There is no distension.  Musculoskeletal:        General: Normal range of motion.     Cervical back: Normal range of motion and neck supple.  Skin:    General: Skin is warm.     Capillary Refill: Capillary refill takes less than 2 seconds.     Comments: Acanthosis   Neurological:     General: No focal deficit present.     Mental Status: He is alert.     Gait: Gait normal.  Psychiatric:        Mood and Affect: Mood normal.        Behavior: Behavior normal.      Labs: No results found for: ISLETAB, No results found for: INSULINAB, No results found for: GLUTAMICACAB, No results found for: ZNT8AB No results found for: LABIA2 No results found for: CPEPTIDE Last hemoglobin A1c:  Lab Results  Component Value Date   HGBA1C 5.6 01/22/2024   Results for orders placed or performed in visit on 08/14/23  Urine cytology ancillary only   Collection Time: 08/14/23 10:06 AM  Result Value Ref Range   Neisseria Gonorrhea Negative    Chlamydia Negative    Comment Normal Reference Ranger Chlamydia - Negative    Comment      Normal Reference Range Neisseria Gonorrhea - Negative  POCT Rapid HIV   Collection Time: 08/14/23 12:05 PM  Result Value Ref Range   Rapid HIV, POC Negative    Lab Results  Component Value Date   HGBA1C 5.6 01/22/2024   HGBA1C 5.6 08/13/2023   HGBA1C 5.4 04/29/2023   Lab Results  Component Value Date   MICROALBUR 1.7 01/22/2024    LDLCALC 86 01/22/2024   CREATININE 0.60 01/22/2024   No results found for: TSH, FREE T4  Assessment/Plan: Metabolic syndrome Overview: Metabolic syndrome diagnosed as he has type 2 diabetes 06/26/2022 when HbA1c 6.5%, MASH, hyperlipidemia and elevated BMI. GLP-1, ozempic  started March 2024. 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 he established care with [facility] Division of Endocrinology 11/22/2020.   Orders: -     Ozempic  (1 MG/DOSE); INJECT 1 MG INTO THE SKIN ONCE A WEEK FOR 4 DOSES  Dispense: 3 mL; Refill: 5 -     Ambulatory referral to Endocrinology  Controlled type 2 diabetes mellitus without complication, without long-term current use of insulin (HCC) Overview: Type 2 diabetes diagnosed 06/26/2022 and treated with lifestyle changes + GLP-1 started March  2024. Annual studies last 01/22/2024.  Assessment & Plan: Diabetes mellitus Type II, under excellent control. The HbA1c is below goal of 7% or lower. Normal micro/alb ratio indicating no nephropathy, HbA1c stable, LDL below treatment goal of >100.  When a patient is on insulin, intensive monitoring of blood glucose levels and continuous insulin titration is vital to avoid hyperglycemia and hypoglycemia. Severe hypoglycemia can lead to seizure or death. Hyperglycemia can lead to ketosis requiring ICU admission and intravenous insulin.   Medications: continued GLP-1; See medication orders below, Laboratory Studies: Reviewed annual studies, Referrals: Adult Endocrinology, and Provided Printed Education Material/has MyChart Access   Orders: -     Ozempic  (1 MG/DOSE); INJECT 1 MG INTO THE SKIN ONCE A WEEK FOR 4 DOSES  Dispense: 3 mL; Refill: 5 -     Ambulatory referral to Endocrinology  Hypertriglyceridemia Overview: Resolved 01/29/2024   Orders: -     Ozempic  (1 MG/DOSE); INJECT 1 MG INTO THE SKIN ONCE A WEEK FOR 4 DOSES  Dispense: 3 mL; Refill: 5  Class 2 severe obesity due to excess calories with serious comorbidity and body mass  index (BMI) of 38.0 to 38.9 in adult (HCC) -     Ozempic  (1 MG/DOSE); INJECT 1 MG INTO THE SKIN ONCE A WEEK FOR 4 DOSES  Dispense: 3 mL; Refill: 5 -     Ambulatory referral to Endocrinology  Counseling for transition from pediatric to adult care provider Assessment & Plan: -Reviewed need to schedule appointment with adult endocrinology as this is our last visit together. Number and address provided. Referral sent again.     Patient Instructions  HbA1c Goals: Our ultimate goal is to achieve the lowest possible HbA1c while avoiding recurrent severe hypoglycemia.  However, all HbA1c goals must be individualized per the American Diabetes Association Clinical Standards. My Hemoglobin A1c History:  Lab Results  Component Value Date   HGBA1C 5.6 01/22/2024   HGBA1C 5.6 08/13/2023   HGBA1C 5.4 04/29/2023   HGBA1C 5.4 12/25/2022   HGBA1C 5.6 09/19/2022   HGBA1C 6.5 (A) 06/26/2022   HGBA1C 6.4 (H) 03/25/2022   HGBA1C 5.7 (H) 11/26/2021   HGBA1C 5.6 05/28/2021   HGBA1C 6.2 (H) 08/16/2020   My goal HbA1c is: < 7 %  This is equivalent to an average blood glucose of:  HbA1c % = Average BG  5  97 (78-120)__ 6  126 (100-152)  7  154 (123-185) 8  183 (147-217)  9  212 (170-249)  10  240 (193-282)  11  269 (217-314)  12  298 (240-347)  13  330    Time in Range (TIR) Goals: Target Range over 70% of the time and Very Low less than 4% of the time.   Latest Reference Range & Units 01/22/24 09:02  Sodium 135 - 146 mmol/L 138  Potassium 3.5 - 5.3 mmol/L 3.6  Chloride 98 - 110 mmol/L 105  CO2 20 - 32 mmol/L 24  Glucose 65 - 139 mg/dL 862  Mean Plasma Glucose mg/dL 885  BUN 7 - 25 mg/dL 9  Creatinine 9.39 - 8.75 mg/dL 9.39  Calcium 8.6 - 89.6 mg/dL 9.1  BUN/Creatinine Ratio 6 - 22 (calc) SEE NOTE:  eGFR > OR = 60 mL/min/1.35m2 142  AG Ratio 1.0 - 2.5 (calc) 1.6  AST 10 - 40 U/L 18  ALT 9 - 46 U/L 34  Total Protein 6.1 - 8.1 g/dL 7.0  Total Bilirubin 0.2 - 1.2 mg/dL 0.5  Total CHOL/HDL  Ratio <5.0 (  calc) 3.8  Cholesterol <200 mg/dL 850  HDL Cholesterol > OR = 40 mg/dL 39 (L)  LDL Cholesterol (Calc) mg/dL (calc) 86  MICROALB/CREAT RATIO <30 mg/g creat 13  Non-HDL Cholesterol (Calc) <130 mg/dL (calc) 889  Triglycerides <150 mg/dL 859  Alkaline phosphatase (APISO) 36 - 130 U/L 84  Vitamin D , 25-Hydroxy 30 - 100 ng/mL 22 (L)  Globulin 1.9 - 3.7 g/dL (calc) 2.7  eAG (mmol/L) mmol/L 6.3  Hemoglobin A1C <5.7 % 5.6  Albumin MSPROF 3.6 - 5.1 g/dL 4.3  Microalb, Ur mg/dL 1.7  Creatinine, Urine 20 - 320 mg/dL 870  (L): Data is abnormally low  Diabetes Management: Start multivitamin with vitamin D  in it. Type 2 diabetes managed with Ozempic  and no regular testing of glucoses. Medications, including insulin and diabetes supplies:  If refills are needed in between visits, please ask your pharmacy to send us  a refill request. Remember that After Hours are for emergencies only.  Check Blood Glucose:  Before breakfast, before lunch, before dinner, at bedtime, and for symptoms of high or low blood glucose as a minimum.  Check BG 2 hours after meals if adjusting doses.   Check more frequently on days with more activity than normal.   Check in the middle of the night when evening insulin doses are changed, on days with extra activity in the evening, and if you suspect overnight low glucoses are occurring.   Send a MyChart message as needed for patterns of high or low glucose levels, or multiple low glucoses. As a general rule, ALWAYS call us  to review your child's blood glucoses IF: Your child has a seizure You have to use multiple doses of glucagon/Baqsimi/Gvoke or glucose gel to bring up the blood sugar  Ketones: Check urine or blood ketones, and if blood glucose is greater than 300 mg/dL (injections) or 240 mg/dL (pump) for over 3 hours after giving insulin, when ill, or if having symptoms of ketones.  Call if Urine Ketones are moderate or large Call if Blood Ketones are moderate  (1-1.5) or large (more than1.5) Exercise Plan:  Do any activity that makes you sweat most days for 60 minutes.  Safety Wear Medical Alert at [facility] [employer] Citizens requesting the Yellow Dot Packages should contact Sergeant Almonor at the [facility] by calling [phone] or e-mail [email or web address]. TEEN REMINDERS:  Check blood glucose before driving and/or wear CGM at all times. If sexually active, use reliable birth control including barrier methods like condoms.  If over 21, use alcohol in moderation only - check glucoses more frequently, & have a snack with no carb coverage. Glucose gel/cake icing for low glucose. Check glucoses in the middle of the night. Education:Please refer to your diabetes education book. A copy can be found here: [email or web address] Other: Schedule an eye exam yearly (if you have had diabetes for 5 years and puberty has started). Recommend dental cleaning every 6 months. Get a flu and Covid-19 vaccine yearly, and all age appropriate vaccinations unless contraindicated. Rotate injections sites and avoid any hard lumps (lipohypertrophy).   Follow-up:   Return for Follow up with adult endocrinology.   Medical decision-making:  I have personally spent 35 minutes involved in face-to-face and non-face-to-face activities for this patient on the day of the visit. Professional time spent includes the following activities, in addition to those noted in the documentation: preparation time/chart review, ordering of medications/tests/procedures, obtaining and/or reviewing separately obtained history, counseling and educating the patient/family/caregiver, performing a medically appropriate examination and/or  evaluation, referring and communicating with other health care professionals for care coordination, and documentation in the EHR. This time does not include the time spent for  CGM interpretation.   Thank you for the opportunity to participate in the care of our mutual patient. Please do not hesitate to contact me should you have any questions regarding the assessment or treatment plan.   Sincerely,   Marce Rucks, MD

## 2024-01-28 NOTE — Patient Instructions (Addendum)
 HbA1c Goals: Our ultimate goal is to achieve the lowest possible HbA1c while avoiding recurrent severe hypoglycemia.  However, all HbA1c goals must be individualized per the American Diabetes Association Clinical Standards. My Hemoglobin A1c History:  Lab Results  Component Value Date   HGBA1C 5.6 01/22/2024   HGBA1C 5.6 08/13/2023   HGBA1C 5.4 04/29/2023   HGBA1C 5.4 12/25/2022   HGBA1C 5.6 09/19/2022   HGBA1C 6.5 (A) 06/26/2022   HGBA1C 6.4 (H) 03/25/2022   HGBA1C 5.7 (H) 11/26/2021   HGBA1C 5.6 05/28/2021   HGBA1C 6.2 (H) 08/16/2020   My goal HbA1c is: < 7 %  This is equivalent to an average blood glucose of:  HbA1c % = Average BG  5  97 (78-120)__ 6  126 (100-152)  7  154 (123-185) 8  183 (147-217)  9  212 (170-249)  10  240 (193-282)  11  269 (217-314)  12  298 (240-347)  13  330    Time in Range (TIR) Goals: Target Range over 70% of the time and Very Low less than 4% of the time.   Latest Reference Range & Units 01/22/24 09:02  Sodium 135 - 146 mmol/L 138  Potassium 3.5 - 5.3 mmol/L 3.6  Chloride 98 - 110 mmol/L 105  CO2 20 - 32 mmol/L 24  Glucose 65 - 139 mg/dL 862  Mean Plasma Glucose mg/dL 885  BUN 7 - 25 mg/dL 9  Creatinine 9.39 - 8.75 mg/dL 9.39  Calcium 8.6 - 89.6 mg/dL 9.1  BUN/Creatinine Ratio 6 - 22 (calc) SEE NOTE:  eGFR > OR = 60 mL/min/1.58m2 142  AG Ratio 1.0 - 2.5 (calc) 1.6  AST 10 - 40 U/L 18  ALT 9 - 46 U/L 34  Total Protein 6.1 - 8.1 g/dL 7.0  Total Bilirubin 0.2 - 1.2 mg/dL 0.5  Total CHOL/HDL Ratio <5.0 (calc) 3.8  Cholesterol <200 mg/dL 850  HDL Cholesterol > OR = 40 mg/dL 39 (L)  LDL Cholesterol (Calc) mg/dL (calc) 86  MICROALB/CREAT RATIO <30 mg/g creat 13  Non-HDL Cholesterol (Calc) <130 mg/dL (calc) 889  Triglycerides <150 mg/dL 859  Alkaline phosphatase (APISO) 36 - 130 U/L 84  Vitamin D , 25-Hydroxy 30 - 100 ng/mL 22 (L)  Globulin 1.9 - 3.7 g/dL (calc) 2.7  eAG (mmol/L) mmol/L 6.3  Hemoglobin A1C <5.7 % 5.6  Albumin MSPROF 3.6  - 5.1 g/dL 4.3  Microalb, Ur mg/dL 1.7  Creatinine, Urine 20 - 320 mg/dL 870  (L): Data is abnormally low  Diabetes Management: Start multivitamin with vitamin D  in it. Type 2 diabetes managed with Ozempic  and no regular testing of glucoses. Medications, including insulin and diabetes supplies:  If refills are needed in between visits, please ask your pharmacy to send us  a refill request. Remember that After Hours are for emergencies only.  Check Blood Glucose:  Before breakfast, before lunch, before dinner, at bedtime, and for symptoms of high or low blood glucose as a minimum.  Check BG 2 hours after meals if adjusting doses.   Check more frequently on days with more activity than normal.   Check in the middle of the night when evening insulin doses are changed, on days with extra activity in the evening, and if you suspect overnight low glucoses are occurring.   Send a MyChart message as needed for patterns of high or low glucose levels, or multiple low glucoses. As a general rule, ALWAYS call us  to review your child's blood glucoses IF: Your child has a  seizure You have to use multiple doses of glucagon/Baqsimi/Gvoke or glucose gel to bring up the blood sugar  Ketones: Check urine or blood ketones, and if blood glucose is greater than 300 mg/dL (injections) or 240 mg/dL (pump) for over 3 hours after giving insulin, when ill, or if having symptoms of ketones.  Call if Urine Ketones are moderate or large Call if Blood Ketones are moderate (1-1.5) or large (more than1.5) Exercise Plan:  Do any activity that makes you sweat most days for 60 minutes.  Safety Wear Medical Alert at [facility] [employer] Citizens requesting the Yellow Dot Packages should contact Sergeant Almonor at the [facility] by calling [phone] or e-mail [email or web address]. TEEN REMINDERS:  Check blood glucose before driving and/or wear CGM at all times. If sexually active, use reliable birth  control including barrier methods like condoms.  If over 21, use alcohol in moderation only - check glucoses more frequently, & have a snack with no carb coverage. Glucose gel/cake icing for low glucose. Check glucoses in the middle of the night. Education:Please refer to your diabetes education book. A copy can be found here: [email or web address] Other: Schedule an eye exam yearly (if you have had diabetes for 5 years and puberty has started). Recommend dental cleaning every 6 months. Get a flu and Covid-19 vaccine yearly, and all age appropriate vaccinations unless contraindicated. Rotate injections sites and avoid any hard lumps (lipohypertrophy).

## 2024-01-29 NOTE — Assessment & Plan Note (Signed)
-  Reviewed need to schedule appointment with adult endocrinology as this is our last visit together. Number and address provided. Referral sent again.

## 2024-01-29 NOTE — Assessment & Plan Note (Addendum)
 Diabetes mellitus Type II, under excellent control. The HbA1c is below goal of 7% or lower. Normal micro/alb ratio indicating no nephropathy, HbA1c stable, LDL below treatment goal of >100.  When a patient is on insulin, intensive monitoring of blood glucose levels and continuous insulin titration is vital to avoid hyperglycemia and hypoglycemia. Severe hypoglycemia can lead to seizure or death. Hyperglycemia can lead to ketosis requiring ICU admission and intravenous insulin.   Medications: continued GLP-1; See medication orders below, Laboratory Studies: Reviewed annual studies, Referrals: Adult Endocrinology, and Provided Printed Education Material/has MyChart Access

## 2024-08-08 ENCOUNTER — Ambulatory Visit: Admitting: "Endocrinology
